# Patient Record
Sex: Female | Born: 1965 | Race: Black or African American | Hispanic: No | Smoking: Never smoker
Health system: Southern US, Community
[De-identification: ages and names within clinical notes are randomized; demographics above are authoritative.]

## PROBLEM LIST (undated history)

## (undated) DIAGNOSIS — F419 Anxiety disorder, unspecified: Secondary | ICD-10-CM

## (undated) DIAGNOSIS — D573 Sickle-cell trait: Secondary | ICD-10-CM

## (undated) DIAGNOSIS — R51 Headache: Secondary | ICD-10-CM

## (undated) DIAGNOSIS — R519 Headache, unspecified: Secondary | ICD-10-CM

## (undated) HISTORY — DX: Headache, unspecified: R51.9

## (undated) HISTORY — DX: Headache: R51

## (undated) HISTORY — DX: Anxiety disorder, unspecified: F41.9

## (undated) HISTORY — PX: TUBAL LIGATION: SHX77

---

## 2007-02-03 ENCOUNTER — Emergency Department: Payer: Self-pay | Admitting: Unknown Physician Specialty

## 2010-09-26 ENCOUNTER — Ambulatory Visit: Payer: Self-pay | Admitting: Neurology

## 2013-08-08 DIAGNOSIS — R42 Dizziness and giddiness: Secondary | ICD-10-CM | POA: Insufficient documentation

## 2013-08-08 DIAGNOSIS — R519 Headache, unspecified: Secondary | ICD-10-CM | POA: Insufficient documentation

## 2013-08-08 DIAGNOSIS — K219 Gastro-esophageal reflux disease without esophagitis: Secondary | ICD-10-CM | POA: Insufficient documentation

## 2013-08-08 DIAGNOSIS — R51 Headache: Secondary | ICD-10-CM

## 2013-11-12 ENCOUNTER — Ambulatory Visit: Payer: Self-pay | Admitting: Neurology

## 2014-06-03 ENCOUNTER — Other Ambulatory Visit: Payer: Self-pay

## 2014-06-03 DIAGNOSIS — Z1231 Encounter for screening mammogram for malignant neoplasm of breast: Secondary | ICD-10-CM

## 2014-08-02 ENCOUNTER — Emergency Department (HOSPITAL_COMMUNITY)
Admission: EM | Admit: 2014-08-02 | Discharge: 2014-08-03 | Disposition: A | Discharge status: Home / Self Care | Payer: BC Managed Care – PPO | Attending: Emergency Medicine | Admitting: Emergency Medicine

## 2014-08-02 ENCOUNTER — Emergency Department (HOSPITAL_COMMUNITY): Payer: BC Managed Care – PPO

## 2014-08-02 ENCOUNTER — Encounter (HOSPITAL_COMMUNITY): Payer: Self-pay | Admitting: Emergency Medicine

## 2014-08-02 DIAGNOSIS — M79601 Pain in right arm: Secondary | ICD-10-CM | POA: Diagnosis present

## 2014-08-02 DIAGNOSIS — Z862 Personal history of diseases of the blood and blood-forming organs and certain disorders involving the immune mechanism: Secondary | ICD-10-CM | POA: Insufficient documentation

## 2014-08-02 DIAGNOSIS — Z79899 Other long term (current) drug therapy: Secondary | ICD-10-CM | POA: Insufficient documentation

## 2014-08-02 DIAGNOSIS — R110 Nausea: Secondary | ICD-10-CM | POA: Insufficient documentation

## 2014-08-02 DIAGNOSIS — M5410 Radiculopathy, site unspecified: Secondary | ICD-10-CM | POA: Insufficient documentation

## 2014-08-02 DIAGNOSIS — Z8639 Personal history of other endocrine, nutritional and metabolic disease: Secondary | ICD-10-CM | POA: Insufficient documentation

## 2014-08-02 DIAGNOSIS — M79603 Pain in arm, unspecified: Secondary | ICD-10-CM

## 2014-08-02 HISTORY — DX: Sickle-cell trait: D57.3

## 2014-08-02 LAB — BASIC METABOLIC PANEL
ANION GAP: 13 (ref 5–15)
BUN: 13 mg/dL (ref 6–23)
CHLORIDE: 102 mmol/L (ref 96–112)
CO2: 22 mmol/L (ref 19–32)
CREATININE: 0.97 mg/dL (ref 0.50–1.10)
Calcium: 8.9 mg/dL (ref 8.4–10.5)
GFR, EST AFRICAN AMERICAN: 79 mL/min — AB (ref >90)
GFR, EST NON AFRICAN AMERICAN: 68 mL/min — AB (ref >90)
Glucose, Bld: 97 mg/dL (ref 70–99)
Potassium: 3.9 mmol/L (ref 3.5–5.1)
Sodium: 137 mmol/L (ref 135–145)

## 2014-08-02 LAB — CBC
HCT: 37.3 % (ref 36.0–46.0)
HEMOGLOBIN: 12.8 g/dL (ref 12.0–15.0)
MCH: 28.4 pg (ref 26.0–34.0)
MCHC: 34.3 g/dL (ref 30.0–36.0)
MCV: 82.7 fL (ref 78.0–100.0)
PLATELETS: 232 10*3/uL (ref 150–400)
RBC: 4.51 MIL/uL (ref 3.87–5.11)
RDW: 13.2 % (ref 11.5–15.5)
WBC: 5.9 10*3/uL (ref 4.0–10.5)

## 2014-08-02 LAB — I-STAT TROPONIN, ED: Troponin i, poc: 0 ng/mL (ref 0.00–0.08)

## 2014-08-02 MED ORDER — OXYCODONE-ACETAMINOPHEN 5-325 MG PO TABS
1.0000 | ORAL_TABLET | Freq: Once | ORAL | Status: AC
  Administered 2014-08-02: 1 via ORAL
  Filled 2014-08-02: qty 1

## 2014-08-02 MED ORDER — ONDANSETRON 4 MG PO TBDP
4.0000 mg | ORAL_TABLET | Freq: Once | ORAL | Status: AC
  Administered 2014-08-02: 4 mg via ORAL
  Filled 2014-08-02: qty 1

## 2014-08-02 MED ORDER — PREDNISONE 20 MG PO TABS
60.0000 mg | ORAL_TABLET | Freq: Once | ORAL | Status: AC
  Administered 2014-08-02: 60 mg via ORAL
  Filled 2014-08-02: qty 3

## 2014-08-02 NOTE — ED Notes (Signed)
C/o right arm numbness/ fingers tingling/ radiates to right chest area- has been taking ibuprofren without relief-- "I feel like I am not getting enough blood to my hand" slight swelling to right hand. Unable to sleep at night due to pain.

## 2014-08-02 NOTE — ED Notes (Addendum)
Pt reports pain in her right arm, mostly the lower arm that radiates up to her rt neck. Reports she feels numbness and pins and needles with no known injury to arm. Pt has full rom in rt arm with some decreased strength in her hand. Pt alert, oriented, nad.

## 2014-08-02 NOTE — ED Provider Notes (Signed)
CSN: [id number]     Arrival date & time 08/02/14  2055 History  This chart was scribed for non-physician practitioner Alexandra Pel, PA working with Jerelyn Scott, MD, by Tanda Rockers, ED Scribe. This patient was seen in room A06C/A06C and the patient's care was started at 11:12 PM.     Chief Complaint  Patient presents with  . right arm pain   . Joint Pain  . Chest Pain    The history is provided by the patient. No language interpreter was used.     HPI Comments: Alexandra Roberts is a 49 y.o. female who presents to the Emergency Department complaining of constant right arm pain that began 3 days ago. She also complains of numbness and paresthesia to the right shoulder down to her fingertips. Pt reports that the symptoms have gradually been worsening, causing her to come to the ED. Pt mentions that she has mild weakness to the arm as well. She does admit that she uses her arm everyday for cooking. Pt reports that the pain radiates to the right side of her right shoulder at night time. She was also feeling nauseas and dizzy earlier today while at church. She denies any recent injury, trauma, or strenuous activity that could have caused the pain.  She denies shortness of breath, diaphoresis, vomiting, painful leg swelling, or any other symptoms.   Past Medical History  Diagnosis Date  . Thyroid disease     no medicine -"just watching it"  . Sickle cell trait    Past Surgical History  Procedure Laterality Date  . Tubal ligation     No family history on file. History  Substance Use Topics  . Smoking status: Never Smoker   . Smokeless tobacco: Not on file  . Alcohol Use: Yes     Comment: socially   OB History    No data available     Review of Systems  Constitutional: Negative for diaphoresis.  Respiratory: Negative for shortness of breath.   Cardiovascular: Negative for chest pain and leg swelling.  Gastrointestinal: Positive for nausea. Negative for vomiting.  Musculoskeletal:  Positive for arthralgias (Right arm pain. ).  Neurological: Positive for weakness (to right arm. ). Negative for dizziness.       Paresthesia to right arm.   All other systems reviewed and are negative.     Allergies  Review of patient's allergies indicates not on file.  Home Medications   Prior to Admission medications   Medication Sig Start Date End Date Taking? Authorizing Provider  Naphazoline-Pheniramine (EYE ALLERGY RELIEF OP) Apply 1 drop to eye daily.   Yes Historical Provider, MD  Polyethyl Glycol-Propyl Glycol (SYSTANE OP) Apply 1 drop to eye daily.   Yes Historical Provider, MD  promethazine (PHENERGAN) 25 MG tablet Take 25 mg by mouth every 6 (six) hours as needed for nausea or vomiting.     Historical Provider, MD   Triage Vitals: BP 114/82 mmHg  Pulse 62  Temp(Src) 98.5 F (36.9 C) (Oral)  Resp 17  Ht  (1.702 m)  Wt 170 lb (77.111 kg)  BMI 26.62 kg/m2  SpO2 98%  LMP 07/02/2014 (Approximate)   Physical Exam  Constitutional: She is oriented to person, place, and time. She appears well-developed and well-nourished. No distress.  HENT:  Head: Normocephalic and atraumatic.  Eyes: Conjunctivae and EOM are normal.  Neck: Neck supple. No spinous process tenderness and no muscular tenderness present. No tracheal deviation present.  Cardiovascular: Normal rate.   Pulmonary/Chest:  Effort normal and breath sounds normal. No respiratory distress. She has no decreased breath sounds. She has no wheezes. She has no rhonchi. She has no rales. She exhibits no tenderness, no bony tenderness, no crepitus and no retraction.  Musculoskeletal: Normal range of motion.       Hands: Right arm has tenderness from proximal shoulder to distal finger. Her radial pulse is strong and physiologic. She has CR < 2-3  In all 5 fingers. No sign of rash. No wounds. FROM but with pain from shoulder to tips of fingers. Symmetrical strength to bilateral lower extremities. No abnormality to the left  extremity.  Neurological: She is alert and oriented to person, place, and time. No cranial nerve deficit.  Skin: Skin is warm and dry.  Psychiatric: She has a normal mood and affect. Her behavior is normal.  Nursing note and vitals reviewed.   ED Course  Procedures (including critical care time)  DIAGNOSTIC STUDIES: Oxygen Saturation is 98% on RA, normal by my interpretation.    COORDINATION OF CARE: 11:17 PM-Discussed treatment plan which includes CBC, BMP, Troponin with pt at bedside and pt agreed to plan.   Labs Review Labs Reviewed  BASIC METABOLIC PANEL - Abnormal; Notable for the following:    GFR calc non Af Amer 68 (*)    GFR calc Af Amer 79 (*)    All other components within normal limits  CBC  I-STAT TROPOININ, ED    Imaging Review Dg Chest 2 View  08/02/2014   CLINICAL DATA:  Left arm numbness in chest pain  EXAM: CHEST  2 VIEW  COMPARISON:  None.  FINDINGS: Normal heart size and mediastinal contours. No acute infiltrate or edema. No effusion or pneumothorax. No acute osseous findings.  IMPRESSION: Negative chest.   Electronically Signed   By: Marnee SpringJonathon  Watts M.D.   On: 08/02/2014 22:43     EKG Interpretation None      MDM   Final diagnoses:  Arm pain    Patients symptoms are MSK and worsened with palpation and movement. She denies having any injury that she can remember that caused her pain to start. She has some mild weakness to the arm which is hard to determine if this is due to true weakness or pain. I discussed getting head and neck CT to r/o nerve impingement versus cranial abnormality causing right arm pain, numbness, and weakness. She is agreeable.  At this time she is low risk for cardiac disease and is PERC negative. Her Troponin, CBC and BMP are all unremarkable. Her chest xray shows no acute findings. Her EKG shows sinus rhythm with normal heart rate.  At end of shift, patient [facility] to [facility] Peipenbrink, PA-C. CT head and neck are  pending. Patient will be given muscle relaxers, prednisone and pain medication if images are negative. Pt is agreeable to plan for CT scans.  Filed Vitals:   08/03/14 0015  BP: 126/86  Pulse: 61  Temp:   Resp:       I personally performed the services described in this documentation, which was scribed in my presence. The recorded information has been reviewed and is accurate.        Alexandra Peliffany Rigby Leonhardt, PA-C 08/03/14 0033  Alexandra Peliffany Abisai Deer, PA-C 08/03/14 [id number]  Shon Batonourtney F Horton, MD 08/05/14 1032

## 2014-08-03 ENCOUNTER — Emergency Department (HOSPITAL_COMMUNITY): Payer: BC Managed Care – PPO

## 2014-08-03 MED ORDER — CYCLOBENZAPRINE HCL 5 MG PO TABS: 5.0000 mg | ORAL_TABLET | Freq: Two times a day (BID) | ORAL | Status: DC | PRN

## 2014-08-03 MED ORDER — HYDROCODONE-ACETAMINOPHEN 5-325 MG PO TABS: 1.0000 | ORAL_TABLET | ORAL | Status: DC | PRN

## 2014-08-03 MED ORDER — PREDNISONE 10 MG PO TABS: 20.0000 mg | ORAL_TABLET | Freq: Every day | ORAL | Status: DC

## 2014-08-03 NOTE — Discharge Instructions (Signed)

## 2014-08-03 NOTE — ED Provider Notes (Signed)
Patient care acquired from Marlon Pel, PA-C  Results for orders placed or performed during the hospital encounter of 08/02/14  CBC  Result Value Ref Range   WBC 5.9 4.0 - 10.5 K/uL   RBC 4.51 3.87 - 5.11 MIL/uL   Hemoglobin 12.8 12.0 - 15.0 g/dL   HCT 16.1 09.6 - 04.5 %   MCV 82.7 78.0 - 100.0 fL   MCH 28.4 26.0 - 34.0 pg   MCHC 34.3 30.0 - 36.0 g/dL   RDW 40.9 81.1 - 91.4 %   Platelets 232 150 - 400 K/uL  Basic metabolic panel  Result Value Ref Range   Sodium 137 135 - 145 mmol/L   Potassium 3.9 3.5 - 5.1 mmol/L   Chloride 102 96 - 112 mmol/L   CO2 22 19 - 32 mmol/L   Glucose, Bld 97 70 - 99 mg/dL   BUN 13 6 - 23 mg/dL   Creatinine, Ser 7.82 0.50 - 1.10 mg/dL   Calcium 8.9 8.4 - 95.6 mg/dL   GFR calc non Af Amer 68 (L) >90 mL/min   GFR calc Af Amer 79 (L) >90 mL/min   Anion gap 13 5 - 15  I-stat troponin, ED (not at [facility])  Result Value Ref Range   Troponin i, poc 0.00 0.00 - 0.08 ng/mL   Comment 3           Dg Chest 2 View  08/02/2014   CLINICAL DATA:  Left arm numbness in chest pain  EXAM: CHEST  2 VIEW  COMPARISON:  None.  FINDINGS: Normal heart size and mediastinal contours. No acute infiltrate or edema. No effusion or pneumothorax. No acute osseous findings.  IMPRESSION: Negative chest.   Electronically Signed   By: Marnee Spring M.D.   On: 08/02/2014 22:43   Ct Head Wo Contrast  08/03/2014   CLINICAL DATA:  Right arm pain with numbness and paresthesias to the right shoulder and all arm for 3 days. Gradual worsening. Mild weakness. Nausea and dizzy today.  EXAM: CT HEAD WITHOUT CONTRAST  CT CERVICAL SPINE WITHOUT CONTRAST  TECHNIQUE: Multidetector CT imaging of the head and cervical spine was performed following the standard protocol without intravenous contrast. Multiplanar CT image reconstructions of the cervical spine were also generated.  COMPARISON:  MRI brain 11/12/2013.  CT head 09/26/2010.  FINDINGS: CT HEAD FINDINGS  Ventricles and sulci appear symmetrical. No  ventricular dilatation. No mass effect or midline shift. No abnormal extra-axial fluid collections. Gray-white matter junctions are distinct. Basal cisterns are not effaced. No evidence of acute intracranial hemorrhage. No depressed skull fractures. Visualized paranasal sinuses and mastoid air cells are not opacified.  CT CERVICAL SPINE FINDINGS  Normal alignment of the cervical spine and facet joints. C1-2 articulation appears intact. No vertebral compression deformities. Intervertebral disc space heights are preserved. No focal bone lesion or bone destruction. Bone cortex and trabecular architecture appear intact. No prevertebral soft tissue swelling. 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 Soft tissues are unremarkable. Diffuse enlargement of the thyroid gland without focal nodule identified. This likely indicates thyroid goiter.  IMPRESSION: No acute intracranial abnormalities. Normal alignment of the cervical spine. No acute displaced fractures identified. Probable thyroid goiter.   Electronically Signed   By: Burman Nieves M.D.   On: 08/03/2014 01:15   Ct Cervical Spine Wo Contrast  08/03/2014   CLINICAL DATA:  Right arm pain with numbness and paresthesias to the right shoulder and all arm for 3 days. Gradual worsening. Mild  weakness. Nausea and dizzy today.  EXAM: CT HEAD WITHOUT CONTRAST  CT CERVICAL SPINE WITHOUT CONTRAST  TECHNIQUE: Multidetector CT imaging of the head and cervical spine was performed following the standard protocol without intravenous contrast. Multiplanar CT image reconstructions of the cervical spine were also generated.  COMPARISON:  MRI brain 11/12/2013.  CT head 09/26/2010.  FINDINGS: CT HEAD FINDINGS  Ventricles and sulci appear symmetrical. No ventricular dilatation. No mass effect or midline shift. No abnormal extra-axial fluid collections. Gray-white matter junctions are distinct. Basal cisterns are not effaced. No evidence of acute intracranial hemorrhage. No  depressed skull fractures. Visualized paranasal sinuses and mastoid air cells are not opacified.  CT CERVICAL SPINE FINDINGS  Normal alignment of the cervical spine and facet joints. C1-2 articulation appears intact. No vertebral compression deformities. Intervertebral disc space heights are preserved. No focal bone lesion or bone destruction. Bone cortex and trabecular architecture appear intact. No prevertebral soft tissue swelling. 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 Soft tissues are unremarkable. Diffuse enlargement of the thyroid gland without focal nodule identified. This likely indicates thyroid goiter.  IMPRESSION: No acute intracranial abnormalities. Normal alignment of the cervical spine. No acute displaced fractures identified. Probable thyroid goiter.   Electronically Signed   By: Burman NievesWilliam  Stevens M.D.   On: 08/03/2014 01:15    1. Radiculopathy of arm   2. Arm pain    Filed Vitals:   08/03/14 0100  BP: 117/98  Pulse: 65  Temp:   Resp: 15   Afebrile, NAD, non-toxic appearing, AAOx4.   Discussed results with patient. Patient is aware of thyroid goiter and is being followed by PCP. Return precautions discussed. Patient is agreeable to plan. Patient is stable at time of discharge   Francee PiccoloJennifer Bethzaida Boord, PA-C 08/04/14 0050  Jerelyn ScottMartha Linker, MD 08/05/14 [phone]

## 2014-08-03 NOTE — ED Notes (Signed)
Patient transported to CT 

## 2015-04-19 IMAGING — CT CT HEAD W/O CM
4 of 6 series · 18 of 47 positions shown, 20 images · non-contrast
Comparison: MRI brain 11/12/2013.  CT head 09/26/2010.

CLINICAL DATA: Right arm pain with numbness and paresthesias to the
right shoulder and all arm for 3 days. Gradual worsening. Mild
weakness. Nausea and dizzy today.

EXAM:
CT HEAD WITHOUT CONTRAST
CT CERVICAL SPINE WITHOUT CONTRAST
TECHNIQUE: Multidetector CT imaging of the head and cervical spine was
performed following the standard protocol without intravenous
contrast. Multiplanar CT image reconstructions of the cervical spine
were also generated.

[Series 202: head w/o bone, idose (1) · axial · non-contrast · 0.44mm/px · z∈[+291,+371]mm · 4 of 64 slices shown]
[im 11/64  bone]
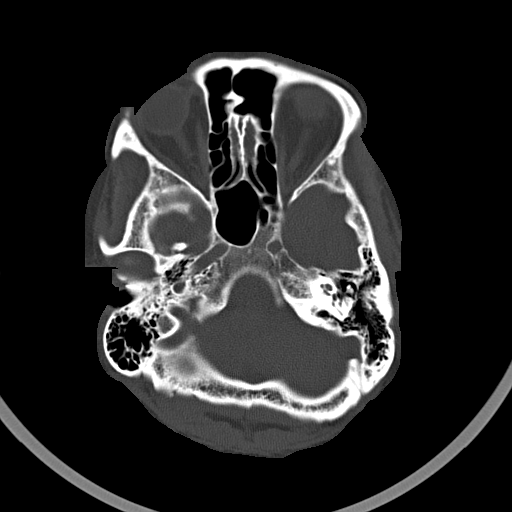
[im 22/64  bone]
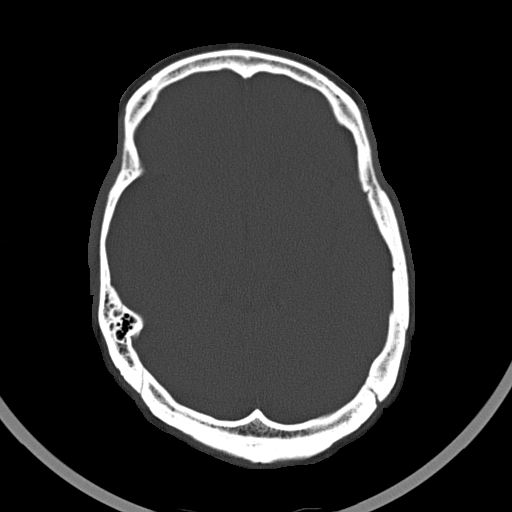
[im 32/64  bone]
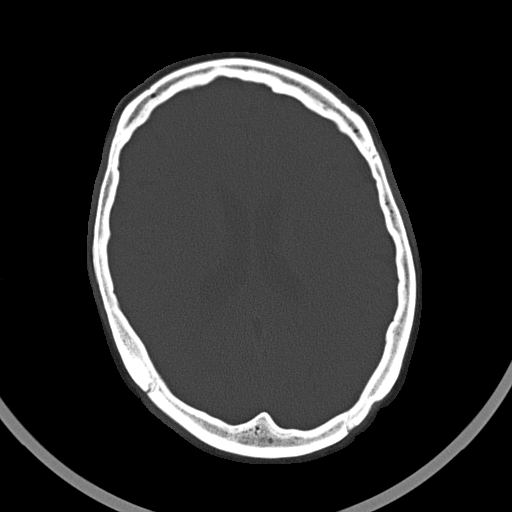
[im 43/64  bone]
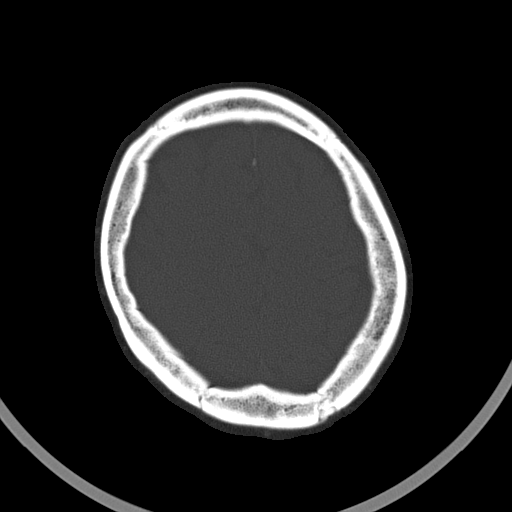

[Series 302: soft tissue, idose (2) · axial · 0.27mm/px · z∈[+143,+277]mm · 8 of 87 slices shown, 10 images]
[im 10/87  brain]
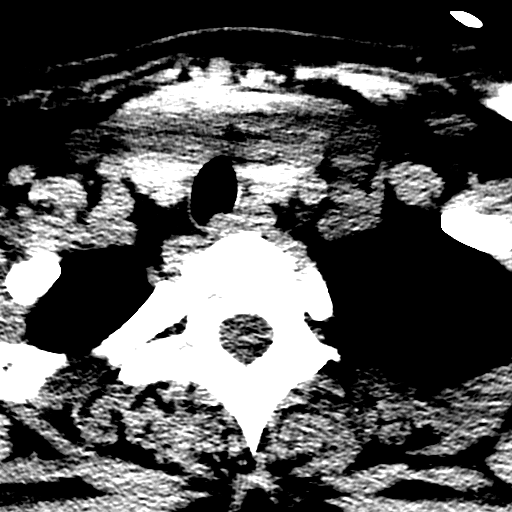
[im 10/87  bone]
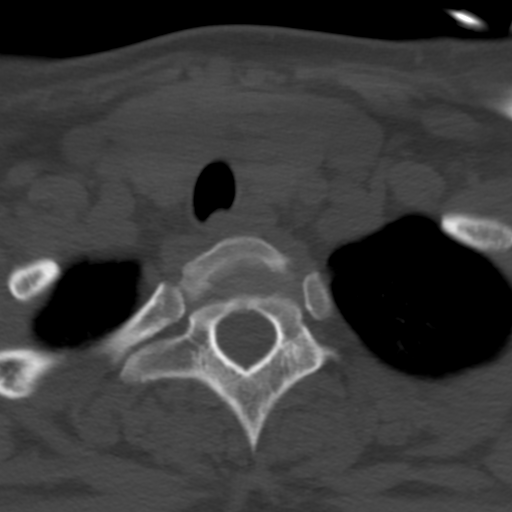
[im 20/87  brain]
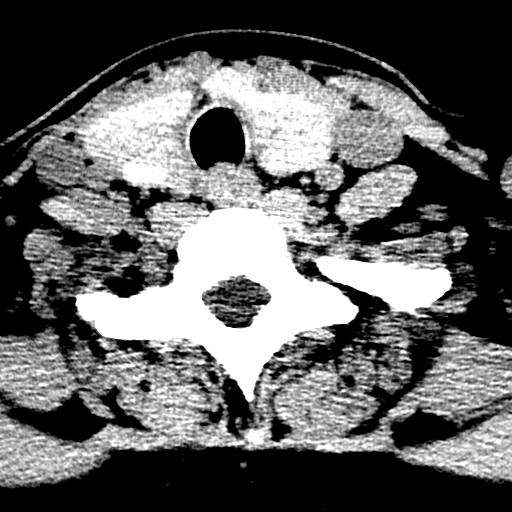
[im 29/87  brain]
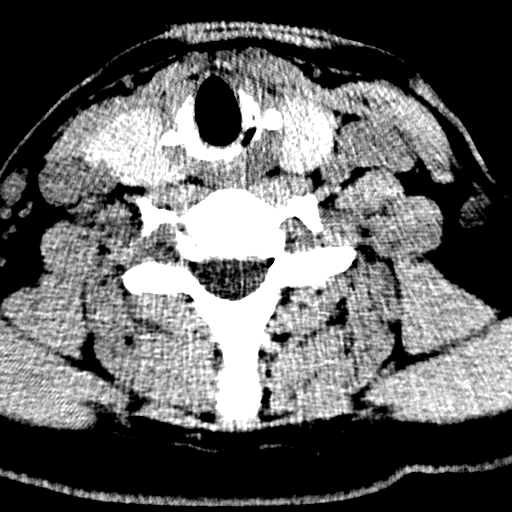
[im 39/87  brain]
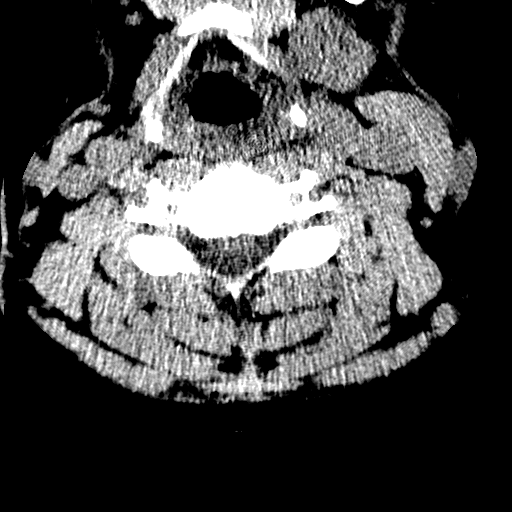
[im 48/87  brain]
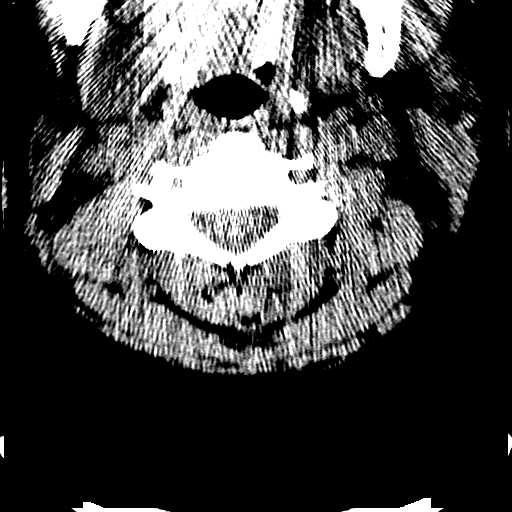
[im 48/87  bone]
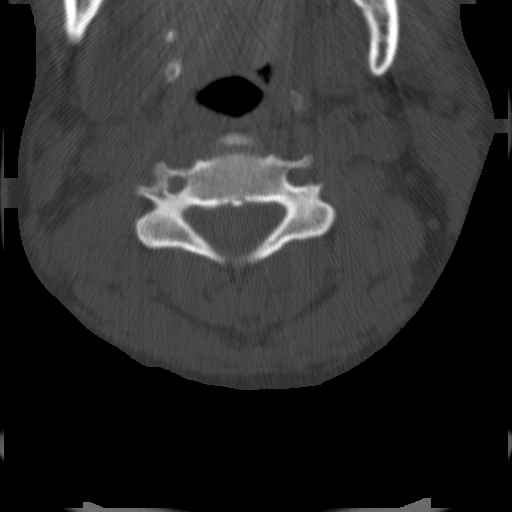
[im 58/87  brain]
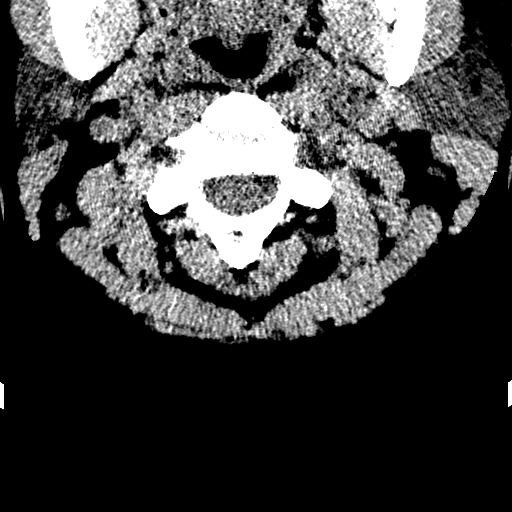
[im 67/87  brain]
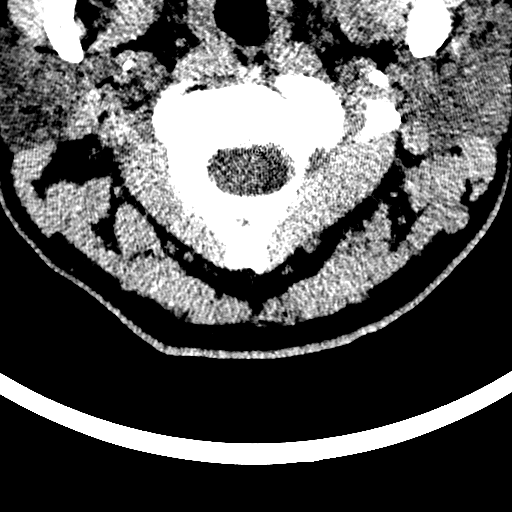
[im 77/87  brain]
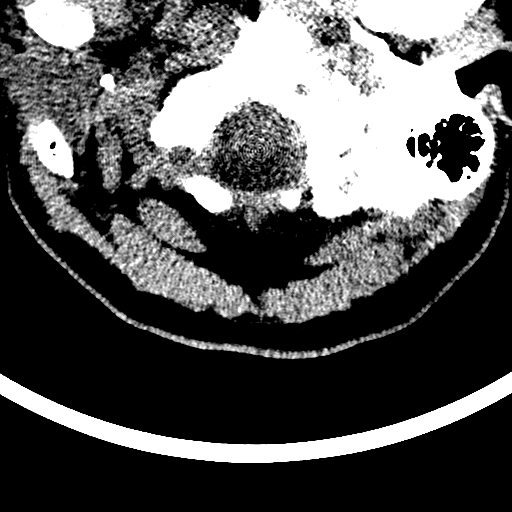

[Series 304: coronal, idose (2) · coronal · 0.33mm/px · 3 of 66 slices shown]
[im 22/66  brain]
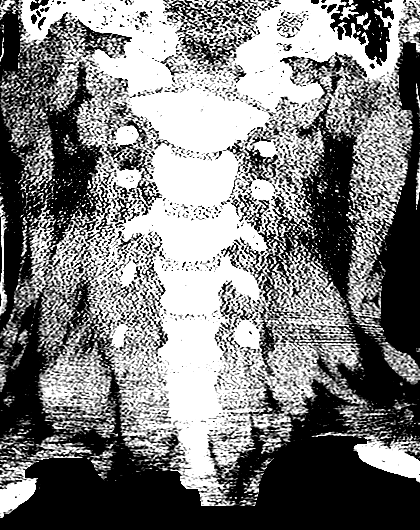
[im 29/66  brain]
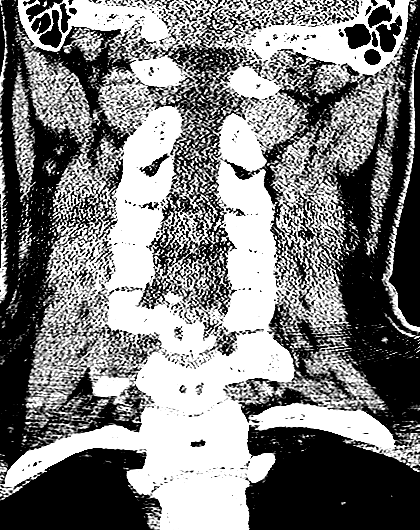
[im 37/66  brain]
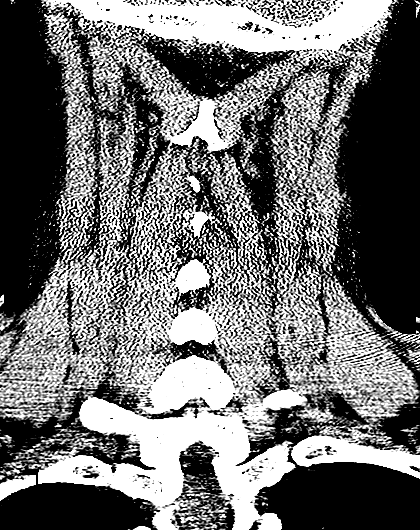

[Series 305: sagittal, idose (2) · sagittal · 0.33mm/px · 3 of 68 slices shown]
[im 23/68  brain]
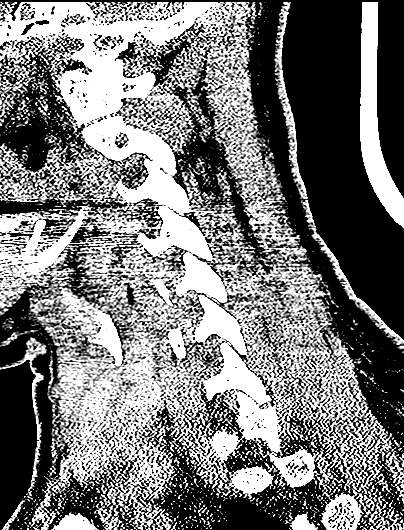
[im 34/68  brain]
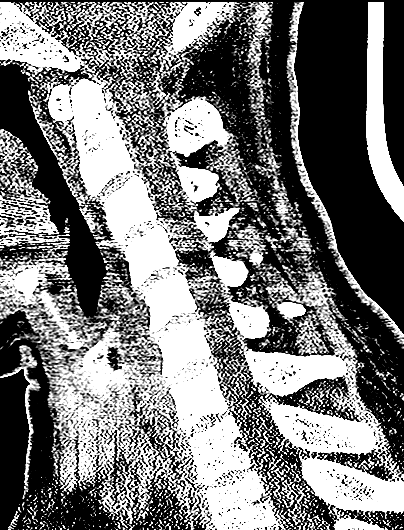
[im 45/68  brain]
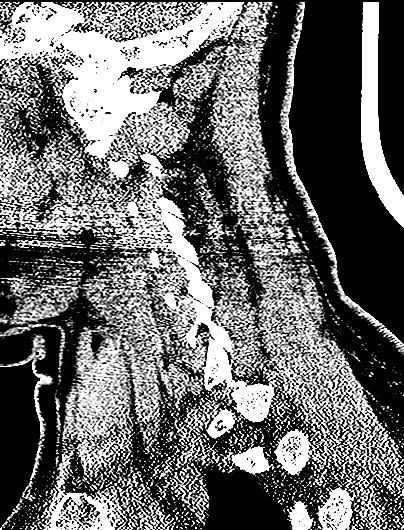

[18 of 47 positions shown; findings below may reference images not displayed]

FINDINGS: CT HEAD FINDINGS

Ventricles and sulci appear symmetrical. No ventricular dilatation.
No mass effect or midline shift. No abnormal extra-axial fluid
collections. Gray-white matter junctions are distinct. Basal
cisterns are not effaced. No evidence of acute intracranial
hemorrhage. No depressed skull fractures. Visualized paranasal
sinuses and mastoid air cells are not opacified.

CT CERVICAL SPINE FINDINGS

Normal alignment of the cervical spine and facet joints. C1-2
articulation appears intact. No vertebral compression deformities.
Intervertebral disc space heights are preserved. No focal bone
lesion or bone destruction. Bone cortex and trabecular architecture
appear intact. No prevertebral soft tissue swelling. Congenital non
fusion of the posterior arch of C1. Soft tissues are unremarkable.
Diffuse enlargement of the thyroid gland without focal nodule
identified. This likely indicates thyroid goiter.
IMPRESSION: No acute intracranial abnormalities. Normal alignment of the
cervical spine. No acute displaced fractures identified. Probable
thyroid goiter.

## 2015-07-30 ENCOUNTER — Ambulatory Visit: Payer: Self-pay | Admitting: Physician Assistant

## 2015-07-30 ENCOUNTER — Encounter: Payer: Self-pay | Admitting: Physician Assistant

## 2015-07-30 VITALS — BP 100/80 | HR 78 | Temp 99.1°F

## 2015-07-30 DIAGNOSIS — N390 Urinary tract infection, site not specified: Secondary | ICD-10-CM

## 2015-07-30 DIAGNOSIS — R300 Dysuria: Secondary | ICD-10-CM

## 2015-07-30 LAB — POCT URINALYSIS DIPSTICK
Bilirubin, UA: NEGATIVE
Glucose, UA: NEGATIVE
Ketones, UA: NEGATIVE
NITRITE UA: NEGATIVE
PH UA: 5.5
Protein, UA: NEGATIVE
Spec Grav, UA: >=1.03
Urobilinogen, UA: 0.2

## 2015-07-30 MED ORDER — CIPROFLOXACIN HCL 250 MG PO TABS: 250.0000 mg | ORAL_TABLET | Freq: Two times a day (BID) | ORAL | Status: AC

## 2015-07-30 NOTE — Progress Notes (Signed)
S:  C/o uti sx for 2 weeks, burning, urgency, frequency, some vaginal discharge, ?if its the monistat or her ; denies abdominal pain or flank pain:  Remainder ros neg, thought it was yeast at first so started using monistat  O:  Vitals wnl, nad, no cva tenderness, back nontender, lungs c t a,cv rrr, abd soft nontender, bs normal, n/v intact  A: uti  P: cipro 250mg  bid x 7d, increase water intake, add cranberry juice, return if not improving in 2 -3 days, return earlier if worsening, discussed pyelonephritis sx

## 2015-08-18 ENCOUNTER — Encounter: Payer: Self-pay | Admitting: Emergency Medicine

## 2015-08-18 ENCOUNTER — Emergency Department
Admission: EM | Admit: 2015-08-18 | Discharge: 2015-08-18 | Disposition: A | Discharge status: Home / Self Care | Payer: 59 | Attending: Emergency Medicine | Admitting: Emergency Medicine

## 2015-08-18 ENCOUNTER — Emergency Department: Payer: 59

## 2015-08-18 DIAGNOSIS — Y999 Unspecified external cause status: Secondary | ICD-10-CM | POA: Insufficient documentation

## 2015-08-18 DIAGNOSIS — Y9289 Other specified places as the place of occurrence of the external cause: Secondary | ICD-10-CM | POA: Diagnosis not present

## 2015-08-18 DIAGNOSIS — Y9389 Activity, other specified: Secondary | ICD-10-CM | POA: Insufficient documentation

## 2015-08-18 DIAGNOSIS — S60221A Contusion of right hand, initial encounter: Secondary | ICD-10-CM | POA: Insufficient documentation

## 2015-08-18 DIAGNOSIS — W228XXA Striking against or struck by other objects, initial encounter: Secondary | ICD-10-CM | POA: Insufficient documentation

## 2015-08-18 DIAGNOSIS — M79641 Pain in right hand: Secondary | ICD-10-CM | POA: Diagnosis present

## 2015-08-18 MED ORDER — NAPROXEN 500 MG PO TABS
500.0000 mg | ORAL_TABLET | Freq: Once | ORAL | Status: AC
  Administered 2015-08-18: 500 mg via ORAL
  Filled 2015-08-18: qty 1

## 2015-08-18 MED ORDER — NAPROXEN 500 MG PO TABS: 500.0000 mg | ORAL_TABLET | Freq: Two times a day (BID) | ORAL | Status: DC

## 2015-08-18 MED ORDER — SULFAMETHOXAZOLE-TRIMETHOPRIM 800-160 MG PO TABS: 1.0000 | ORAL_TABLET | Freq: Once | ORAL | Status: DC

## 2015-08-18 MED ORDER — TETANUS-DIPHTH-ACELL PERTUSSIS 5-2.5-18.5 LF-MCG/0.5 IM SUSP: 0.5000 mL | Freq: Once | INTRAMUSCULAR | Status: DC

## 2015-08-18 MED ORDER — IBUPROFEN 800 MG PO TABS: 800.0000 mg | ORAL_TABLET | Freq: Once | ORAL | Status: DC

## 2015-08-18 MED ORDER — OXYCODONE-ACETAMINOPHEN 7.5-325 MG PO TABS: 1.0000 | ORAL_TABLET | Freq: Four times a day (QID) | ORAL | Status: DC | PRN

## 2015-08-18 MED ORDER — TRAMADOL HCL 50 MG PO TABS
50.0000 mg | ORAL_TABLET | Freq: Once | ORAL | Status: DC
  Filled 2015-08-18: qty 1

## 2015-08-18 NOTE — ED Provider Notes (Signed)
[facility] Emergency Department Provider Note  ____________________________________________  Time seen: Approximately 10:12 PM  I have reviewed the triage vital signs and the nursing notes.   HISTORY  Chief Complaint No chief complaint on file.    HPI Alexandra Roberts is a 50 y.o. female patient complaining of right hand pain secondary to a contusion occurred at work today. Patient states she hit her hand on a metal structure at work and since that she's had pain radiating from the impact site to her upper arm.Patient stated this numbness to the fingertips. Notes that the patient is keeping the wrist and affects position. Patient states increased pain with extension or placed in a neutral position. Palliative measures for this complaint. Patient is left-hand dominant. Patient rates the pain as a 10 over 10.   Past Medical History  Diagnosis Date  . Thyroid disease     no medicine -"just watching it"  . Sickle cell trait (HCC)     There are no active problems to display for this patient.   Past Surgical History  Procedure Laterality Date  . Tubal ligation      Current Outpatient Rx  Name  Route  Sig  Dispense  Refill  . cyclobenzaprine (FLEXERIL) 5 MG tablet   Oral   Take 1 tablet (5 mg total) by mouth 2 (two) times daily as needed for muscle spasms. Patient not taking: Reported on 07/30/2015   20 tablet   0   . HYDROcodone-acetaminophen (NORCO/VICODIN) 5-325 MG per tablet   Oral   Take 1 tablet by mouth every 4 (four) hours as needed. Patient not taking: Reported on 07/30/2015   14 tablet   0   . Naphazoline-Pheniramine (EYE ALLERGY RELIEF OP)   Ophthalmic   Apply 1 drop to eye daily. Reported on 07/30/2015         . naproxen (NAPROSYN) 500 MG tablet   Oral   Take 1 tablet (500 mg total) by mouth 2 (two) times daily with a meal.   20 tablet   0   . oxyCODONE-acetaminophen (PERCOCET) 7.5-325 MG tablet   Oral   Take 1 tablet by mouth  every 6 (six) hours as needed for severe pain.   12 tablet   0   . Polyethyl Glycol-Propyl Glycol (SYSTANE OP)   Ophthalmic   Apply 1 drop to eye daily. Reported on 07/30/2015         . predniSONE (DELTASONE) 10 MG tablet   Oral   Take 2 tablets (20 mg total) by mouth daily. Patient not taking: Reported on 07/30/2015   21 tablet   0     Prednisone dose pack directions:   6 tabs on day ...   . promethazine (PHENERGAN) 25 MG tablet   Oral   Take 25 mg by mouth every 6 (six) hours as needed for nausea or vomiting. Reported on 07/30/2015           Allergies Review of patient's allergies indicates no known allergies.  No family history on file.  Social History Social History  Substance Use Topics  . Smoking status: Never Smoker   . Smokeless tobacco: None  . Alcohol Use: Yes     Comment: socially    Review of Systems Constitutional: No fever/chills Eyes: No visual changes. ENT: No sore throat. Cardiovascular: Denies chest pain. Respiratory: Denies shortness of breath. Gastrointestinal: No abdominal pain.  No nausea, no vomiting.  No diarrhea.  No constipation. Genitourinary: Negative for dysuria. Musculoskeletal:  Right hand pain.  Skin: Negative for rash. Neurological: Negative for headaches, focal weakness or numbness. Endocrine:Hypothyroidism Hematological/Lymphatic:Sickle cell trait ____________________________________________   PHYSICAL EXAM:  VITAL SIGNS: ED Triage Vitals  Enc Vitals Group     BP 08/18/15 2158 133/84 mmHg     Pulse Rate 08/18/15 2158 68     Resp 08/18/15 2158 20     Temp 08/18/15 2158 97.9 F (36.6 C)     Temp Source 08/18/15 2158 Oral     SpO2 08/18/15 2158 97 %     Weight 08/18/15 2158 170 lb (77.111 kg)     Height 08/18/15 2158  (1.702 m)     Head Cir --      Peak Flow --      Pain Score 08/18/15 2156 10     Pain Loc --      Pain Edu? --      Excl. in GC? --     Constitutional: Alert and oriented. Well appearing  and in no acute distress. Eyes: Conjunctivae are normal. PERRL. EOMI. Head: Atraumatic. Nose: No congestion/rhinnorhea. Mouth/Throat: Mucous membranes are moist.  Oropharynx non-erythematous. Neck: No stridor. No cervical spine tenderness to palpation. Hematological/Lymphatic/Immunilogical: No cervical lymphadenopathy. Cardiovascular: Normal rate, regular rhythm. Grossly normal heart sounds.  Good peripheral circulation. Respiratory: Normal respiratory effort.  No retractions. Lungs CTAB. Gastrointestinal: Soft and nontender. No distention. No abdominal bruits. No CVA tenderness. Musculoskeletal: No lower extremity tenderness nor edema.  No joint effusions. Neurologic:  Normal speech and language. No gross focal neurologic deficits are appreciated. No gait instability. Skin:  Skin is warm, dry and intact. No rash noted. Psychiatric: Mood and affect are normal. Speech and behavior are normal.  ____________________________________________   LABS (all labs ordered are listed, but only abnormal results are displayed)  Labs Reviewed - No data to display ____________________________________________  EKG   ____________________________________________  RADIOLOGY  No acute findings on x-ray of the right hand. I, Joni Reining, personally viewed and evaluated these images (plain radiographs) as part of my medical decision making, as well as reviewing the written report by the radiologist.  ____________________________________________   PROCEDURES  Procedure(s) performed: None  Critical Care performed: No  ____________________________________________   INITIAL IMPRESSION / ASSESSMENT AND PLAN / ED COURSE  Pertinent labs & imaging results that were available during my care of the patient were reviewed by me and considered in my medical decision making (see chart for details).  Right hand contusion. Discussed x-ray finding with patient. Patient placed in a volar splint and sling  for comfort. Patient given a prescription for naproxen and Percocets. Patient given a work note for limited duty. Patient advised to follow-up with family doctor if no improvement in 3 days. ____________________________________________   FINAL CLINICAL IMPRESSION(S) / ED DIAGNOSES  Final diagnoses:  Hand contusion, right, initial encounter       Joni Reining, PA-C 08/18/15 2250  Joni Reining, PA-C 08/18/15 2252  Maurilio Lovely, MD 08/18/15 2326

## 2015-08-18 NOTE — ED Notes (Signed)
Worker's comp complete at this time and delivered to lab for courier p/u.

## 2015-08-18 NOTE — ED Notes (Addendum)
Patient ambulatory to triage with steady gait, without difficulty or distress noted; pt reports hitting hand on a bar at work today; now with pain from right side of neck radiating down right arm; st "feels like I might have jammed it"; pt employeed with [employer]; workers comp profile indicates UDS and breath analysis required;

## 2015-08-18 NOTE — Discharge Instructions (Signed)
Wear splint and sling for 2-3 days as needed. Hand Contusion  A hand contusion is a deep bruise to the hand. Contusions happen when an injury causes bleeding under the skin. Signs of bruising include pain, puffiness (swelling), and discolored skin. The contusion may turn blue, purple, or yellow. HOME CARE  Put ice on the injured area.  Put ice in a plastic bag.  Place a towel between your skin and the bag.  Leave the ice on for 15-20 minutes, 03-04 times a day.  Only take medicines as told by your doctor.  Use an elastic wrap only as told. You may remove the wrap for sleeping, showering, and bathing. Take the wrap off if you lose feeling (have numbness) in your fingers, or they turn blue or cold. Put the wrap on more loosely.  Keep the hand raised (elevated) with pillows.  Avoid using your hand too much if it is painful. GET HELP RIGHT AWAY IF:   You have more redness, puffiness, or pain in your hand.  Your puffiness or pain does not get better with medicine.  You lose feeling in your hand, or you cannot move your fingers.  Your hand turns cold or blue.  You have pain when you move your fingers.  Your hand feels warm.  Your contusion does not get better in 2 days. MAKE SURE YOU:   Understand these instructions.  Will watch this condition.  Will get help right away if you are not doing well or you get worse.   This information is not intended to replace advice given to you by your health care provider. Make sure you discuss any questions you have with your health care provider.   Document Released: 10/04/2007 Document Revised: 05/08/2014 Document Reviewed: 10/09/2011 Elsevier Interactive Patient Education [employer]2016 [employer]

## 2015-08-20 ENCOUNTER — Encounter: Payer: Self-pay | Admitting: Nurse Practitioner

## 2015-08-20 ENCOUNTER — Ambulatory Visit (INDEPENDENT_AMBULATORY_CARE_PROVIDER_SITE_OTHER): Payer: 59 | Admitting: Nurse Practitioner

## 2015-08-20 VITALS — BP 98/70 | HR 69 | Temp 98.3°F | Ht 67.0 in | Wt 177.0 lb

## 2015-08-20 DIAGNOSIS — F411 Generalized anxiety disorder: Secondary | ICD-10-CM

## 2015-08-20 DIAGNOSIS — Z7189 Other specified counseling: Secondary | ICD-10-CM | POA: Diagnosis not present

## 2015-08-20 DIAGNOSIS — R350 Frequency of micturition: Secondary | ICD-10-CM | POA: Diagnosis not present

## 2015-08-20 DIAGNOSIS — K921 Melena: Secondary | ICD-10-CM

## 2015-08-20 DIAGNOSIS — Z7689 Persons encountering health services in other specified circumstances: Secondary | ICD-10-CM | POA: Insufficient documentation

## 2015-08-20 DIAGNOSIS — G44209 Tension-type headache, unspecified, not intractable: Secondary | ICD-10-CM

## 2015-08-20 DIAGNOSIS — K219 Gastro-esophageal reflux disease without esophagitis: Secondary | ICD-10-CM

## 2015-08-20 DIAGNOSIS — R300 Dysuria: Secondary | ICD-10-CM | POA: Diagnosis not present

## 2015-08-20 LAB — COMPREHENSIVE METABOLIC PANEL
ALBUMIN: 4.3 g/dL (ref 3.5–5.2)
ALK PHOS: 41 U/L (ref 39–117)
ALT: 10 U/L (ref 0–35)
AST: 14 U/L (ref 0–37)
BILIRUBIN TOTAL: 0.4 mg/dL (ref 0.2–1.2)
BUN: 12 mg/dL (ref 6–23)
CO2: 25 mEq/L (ref 19–32)
Calcium: 9.4 mg/dL (ref 8.4–10.5)
Chloride: 105 mEq/L (ref 96–112)
Creatinine, Ser: 0.78 mg/dL (ref 0.40–1.20)
GFR: 100.79 mL/min (ref >60.00)
GLUCOSE: 93 mg/dL (ref 70–99)
POTASSIUM: 4.2 meq/L (ref 3.5–5.1)
SODIUM: 139 meq/L (ref 135–145)
TOTAL PROTEIN: 7.1 g/dL (ref 6.0–8.3)

## 2015-08-20 LAB — T4, FREE: Free T4: 0.91 ng/dL (ref 0.60–1.60)

## 2015-08-20 LAB — TSH: TSH: 0.64 u[IU]/mL (ref 0.35–4.50)

## 2015-08-20 MED ORDER — BUSPIRONE HCL 7.5 MG PO TABS: 7.5000 mg | ORAL_TABLET | Freq: Three times a day (TID) | ORAL | Status: AC

## 2015-08-20 NOTE — Progress Notes (Signed)
Pre visit review using our clinic review tool, if applicable. No additional management support is needed unless otherwise documented below in the visit note. 

## 2015-08-20 NOTE — Patient Instructions (Addendum)
Welcome to [facility]! Nice to meet you.  Please visit the lab today  See you in 2 weeks for medication consultation.   Please take a probiotic ( Align, Floraque or Culturelle) helps with digestion concerns.   Start the buspirone at night and can take up to 2 x during the day after 7 days.   [facility]  Address: [address], [place], [place] [address]  Hours:  M-F 8AM-5PM  Phone: [phone]

## 2015-08-20 NOTE — Progress Notes (Signed)
Patient ID: Alexandra Roberts, female    DOB: 1965/08/23  Age: 50 y.o. MRN: [id number]  CC: New Patient (Initial Visit) and Dysuria   HPI Alexandra Roberts presents for establishing care and CC of urinary concerns, BM concerns, Forgetting, and other   1) New Pt Info:   Immunizations- Unknown   Mammogram- 2016- [facility]   Pap- Due, 3 years ago approx   2) Chronic Problems-  Blood in stool after straining last night   Thyroid problem- non-specific, just enlarged, used phentermine for wt loss 6-8 months ago last use.   Vertigo- see Frequent HA   Urinary incontinence- frequency   Counseling in the last 5 yrs-   Frequent HA, fainting spells- CT scan normal 3 years ago    Still feels light headed, HA- mild, dry eyes (restasis was too expensive). Saw Dr. Manuella Ghazi in past neurology   3) Acute Problems-  Urinalysis normal today, having frequency, recently checked for UTI w/ treatment with cipro, anxiousness associated with this. Intermittently dysuria.   BM- last year lost bowels, formed stools, small amount of bright red blood on tissue. Soft stool- smooth move stool tea yesterday. Goes twice a week approx. Uses laxatives- tea usually 1-2 x a month and a "pill" once, prune juice. Hard/bloated stomach   Forgetful- over the past few years, forgetful of names, dates   Anxiety- for years she reports, nervous, fidgeting, nervous stomach  Lot of stress at work, personal relationships at work, bullying    History Minsa has a past medical history of Thyroid disease; Sickle cell trait ([place]); Fainting spell; Dizziness; Frequent headaches; Frequent urination; Frequent bowel movements; Nervousness; Forgetfulness; and Vertigo.   She has past surgical history that includes Tubal ligation.   Her family history includes Hypertension in her mother.She reports that she has never smoked. She does not have any smokeless tobacco history on file. She reports that she drinks alcohol. She reports that she does not use illicit  drugs.  Outpatient Prescriptions Prior to Visit  Medication Sig Dispense Refill  . Naphazoline-Pheniramine (EYE ALLERGY RELIEF OP) Apply 1 drop to eye daily. Reported on 08/20/2015    . naproxen (NAPROSYN) 500 MG tablet Take 1 tablet (500 mg total) by mouth 2 (two) times daily with a meal. 20 tablet 0  . promethazine (PHENERGAN) 25 MG tablet Take 25 mg by mouth every 6 (six) hours as needed for nausea or vomiting. Reported on 07/30/2015    . Polyethyl Glycol-Propyl Glycol (SYSTANE OP) Apply 1 drop to eye daily. Reported on 08/20/2015    . cyclobenzaprine (FLEXERIL) 5 MG tablet Take 1 tablet (5 mg total) by mouth 2 (two) times daily as needed for muscle spasms. (Patient not taking: Reported on 07/30/2015) 20 tablet 0  . HYDROcodone-acetaminophen (NORCO/VICODIN) 5-325 MG per tablet Take 1 tablet by mouth every 4 (four) hours as needed. (Patient not taking: Reported on 07/30/2015) 14 tablet 0  . oxyCODONE-acetaminophen (PERCOCET) 7.5-325 MG tablet Take 1 tablet by mouth every 6 (six) hours as needed for severe pain. (Patient not taking: Reported on 08/20/2015) 12 tablet 0  . predniSONE (DELTASONE) 10 MG tablet Take 2 tablets (20 mg total) by mouth daily. (Patient not taking: Reported on 07/30/2015) 21 tablet 0   No facility-administered medications prior to visit.    ROS Review of Systems  Constitutional: Negative for fever, chills, diaphoresis and fatigue.  Respiratory: Negative for cough, chest tightness, shortness of breath and wheezing.   Cardiovascular: Negative for chest pain, palpitations and leg swelling.  Gastrointestinal:  Positive for blood in stool. Negative for nausea, vomiting and diarrhea.  Genitourinary: Positive for frequency. Negative for dysuria and urgency.  Musculoskeletal: Positive for arthralgias.       Right hand pain  Skin: Negative for rash.  Neurological: Positive for dizziness and headaches.  Psychiatric/Behavioral: Positive for confusion and decreased concentration.  Negative for suicidal ideas and sleep disturbance. The patient is nervous/anxious.     Objective:  BP 98/70 mmHg  Pulse 69  Temp(Src) 98.3 F (36.8 C) (Oral)  Ht 5' 7"  (1.702 m)  Wt 177 lb (80.287 kg)  BMI 27.72 kg/m2  SpO2 98%  LMP 08/04/2015 (Exact Date)  Physical Exam  Constitutional: She is oriented to person, place, and time. She appears well-developed and well-nourished. No distress.  HENT:  Head: Normocephalic and atraumatic.  Right Ear: External ear normal.  Left Ear: External ear normal.  Eyes: EOM are normal. Pupils are equal, round, and reactive to light. Right eye exhibits no discharge. Left eye exhibits no discharge. No scleral icterus.  Cardiovascular: Normal rate, regular rhythm and normal heart sounds.  Exam reveals no gallop and no friction rub.   No murmur heard. Pulmonary/Chest: Effort normal and breath sounds normal. No respiratory distress. She has no wheezes. She has no rales. She exhibits no tenderness.  Abdominal: Soft. Bowel sounds are normal. She exhibits no distension and no mass. There is no tenderness. There is no rebound and no guarding.  Genitourinary:  No sign of blood or hemorrhoids today  Musculoskeletal: She exhibits no edema or tenderness.  R arm in sling and wrapped- contusion and seen in ED on 4/19  Neurological: She is alert and oriented to person, place, and time. No cranial nerve deficit. She exhibits normal muscle tone. Coordination normal.  Skin: Skin is warm and dry. No rash noted. She is not diaphoretic.  Psychiatric: She has a normal mood and affect. Her behavior is normal. Judgment and thought content normal.   Assessment & Plan:   Masiah was seen today for new patient (initial visit) and dysuria.  Diagnoses and all orders for this visit:  Generalized anxiety disorder -     TSH -     T4, free -     Comp Met (CMET)  Encounter to establish care  Urinary frequency -     Urine Culture  Gastroesophageal reflux disease without  esophagitis  Tension-type headache, not intractable, unspecified chronicity pattern  Blood in stool  Other orders -     busPIRone (BUSPAR) 7.5 MG tablet; Take 1 tablet (7.5 mg total) by mouth 3 (three) times daily.   I have discontinued Ms. Preast's promethazine, predniSONE, HYDROcodone-acetaminophen, cyclobenzaprine, oxyCODONE-acetaminophen, and naproxen. I am also having her start on busPIRone. Additionally, I am having her maintain her Polyethyl Glycol-Propyl Glycol (SYSTANE OP) and Naphazoline-Pheniramine (EYE ALLERGY RELIEF OP).  Meds ordered this encounter  Medications  . busPIRone (BUSPAR) 7.5 MG tablet    Sig: Take 1 tablet (7.5 mg total) by mouth 3 (three) times daily.    Dispense:  90 tablet    Refill:  1    Order Specific Question:  Supervising Provider    Answer:  Crecencio Mc [[id number]]     Follow-up: Return in about 2 weeks (around 09/03/2015) for Wt loss medication .

## 2015-08-20 NOTE — Assessment & Plan Note (Addendum)
Discussed acute and chronic issues. Reviewed health maintenance measures, PFSHx, and immunizations.   Saw [facility] Dear at [facility] in past

## 2015-08-22 LAB — URINE CULTURE
COLONY COUNT: NO GROWTH
ORGANISM ID, BACTERIA: NO GROWTH

## 2015-08-29 ENCOUNTER — Encounter: Payer: Self-pay | Admitting: Nurse Practitioner

## 2015-08-29 DIAGNOSIS — R350 Frequency of micturition: Secondary | ICD-10-CM | POA: Insufficient documentation

## 2015-08-29 DIAGNOSIS — R300 Dysuria: Secondary | ICD-10-CM | POA: Insufficient documentation

## 2015-08-29 DIAGNOSIS — F411 Generalized anxiety disorder: Secondary | ICD-10-CM | POA: Insufficient documentation

## 2015-08-29 DIAGNOSIS — K921 Melena: Secondary | ICD-10-CM | POA: Insufficient documentation

## 2015-08-29 NOTE — Assessment & Plan Note (Signed)
Stable currently  Watches triggers

## 2015-08-29 NOTE — Assessment & Plan Note (Addendum)
No sign of blood today, no stool to sample  Pt was told to decrease use of laxatives  Stool softeners and fiber in diet  Probiotics recommended

## 2015-08-29 NOTE — Assessment & Plan Note (Addendum)
Counseling in past Work is primary aggravator Buspirone started and gave instructions on slow titration FU in 2 weeks

## 2015-08-29 NOTE — Assessment & Plan Note (Signed)
HA- pt reports frequent HA, vertigo, and anxiety. Suspect anxiety is a large player in these symptoms Checking basic labs today  FU 2 weeks and obtain records

## 2015-08-29 NOTE — Assessment & Plan Note (Signed)
Obtaining culture today to repeat after UTI Pt still reports frequency  Pt reports anxiousness is associated with this

## 2015-09-07 ENCOUNTER — Ambulatory Visit (INDEPENDENT_AMBULATORY_CARE_PROVIDER_SITE_OTHER): Payer: 59 | Admitting: Nurse Practitioner

## 2015-09-07 ENCOUNTER — Encounter: Payer: Self-pay | Admitting: Nurse Practitioner

## 2015-09-07 VITALS — BP 100/72 | HR 62 | Temp 97.7°F | Ht 67.0 in | Wt 180.0 lb

## 2015-09-07 DIAGNOSIS — E663 Overweight: Secondary | ICD-10-CM

## 2015-09-07 DIAGNOSIS — R350 Frequency of micturition: Secondary | ICD-10-CM | POA: Diagnosis not present

## 2015-09-07 LAB — POCT URINALYSIS DIPSTICK
BILIRUBIN UA: NEGATIVE
GLUCOSE UA: NEGATIVE
KETONES UA: NEGATIVE
Leukocytes, UA: NEGATIVE
Nitrite, UA: NEGATIVE
Protein, UA: NEGATIVE
RBC UA: NEGATIVE
SPEC GRAV UA: 1.02
Urobilinogen, UA: 0.2
pH, UA: 5.5

## 2015-09-07 MED ORDER — MIRABEGRON ER 25 MG PO TB24: 25.0000 mg | ORAL_TABLET | Freq: Every day | ORAL | Status: AC

## 2015-09-07 MED ORDER — PHENTERMINE HCL 37.5 MG PO TABS: 37.5000 mg | ORAL_TABLET | Freq: Every day | ORAL | Status: AC

## 2015-09-07 NOTE — Progress Notes (Signed)
Pre visit review using our clinic review tool, if applicable. No additional management support is needed unless otherwise documented below in the visit note. 

## 2015-09-07 NOTE — Progress Notes (Signed)
Patient ID: Alexandra Roberts, female    DOB: 31-Oct-1965  Age: 50 y.o. MRN: [id number]  CC: Follow-up   HPI Alexandra Roberts presents for follow up for weight loss medication.   1) Diet- 24 hr recall  Eggs and bacon Grapes Naked Taco- ground [place]key, lettuce, cheese, tomatoes, no shell Warm water with lemon and cranberry juice- cocktail box 8 oz of water daily  Gained 40 lbs per patient over the last year per pt  Been on phentermine in past with success and denies side effects like jitteriness, palpitations, nor chest pain.   2) Urinary frequency  Using the bathroom every 30 minutes she feels like. She can't go from 1 location to another without stopping to use the restroom.  Denies odor, hematuria  Nocturia 4-5 x at night No prior work up 2 years of symptoms   History Alexandra Roberts has a past medical history of Thyroid disease; Sickle cell trait ([facility]); Fainting spell; Dizziness; Frequent headaches; Frequent urination; Frequent bowel movements; Nervousness; Forgetfulness; and Vertigo.   She has past surgical history that includes Tubal ligation.   Her family history includes Hypertension in her mother.She reports that she has never smoked. She does not have any smokeless tobacco history on file. She reports that she drinks alcohol. She reports that she does not use illicit drugs.  Outpatient Prescriptions Prior to Visit  Medication Sig Dispense Refill  . busPIRone (BUSPAR) 7.5 MG tablet Take 1 tablet (7.5 mg total) by mouth 3 (three) times daily. 90 tablet 1  . Naphazoline-Pheniramine (EYE ALLERGY RELIEF OP) Apply 1 drop to eye daily. Reported on 08/20/2015    . Polyethyl Glycol-Propyl Glycol (SYSTANE OP) Apply 1 drop to eye daily. Reported on 08/20/2015     No facility-administered medications prior to visit.    ROS Review of Systems  Constitutional: Positive for appetite change. Negative for fever, chills, diaphoresis, activity change, fatigue and unexpected weight change.       Increased  appetite  Respiratory: Negative for chest tightness, shortness of breath and wheezing.   Cardiovascular: Negative for chest pain, palpitations and leg swelling.  Gastrointestinal: Negative for nausea, vomiting and diarrhea.  Genitourinary: Positive for frequency. Negative for dysuria, urgency, hematuria and decreased urine volume.  Skin: Negative for rash.  Neurological: Negative for dizziness, weakness, numbness and headaches.  Psychiatric/Behavioral: The patient is not nervous/anxious.     Objective:  BP 100/72 mmHg  Pulse 62  Temp(Src) 97.7 F (36.5 C) (Oral)  Ht 5\' 7"  (1.702 m)  Wt 180 lb (81.647 kg)  BMI 28.19 kg/m2  SpO2 98%  LMP 08/04/2015 (Exact Date)  Physical Exam  Constitutional: She is oriented to person, place, and time. She appears well-developed and well-nourished. No distress.  HENT:  Head: Normocephalic and atraumatic.  Right Ear: External ear normal.  Left Ear: External ear normal.  Cardiovascular: Normal rate, regular rhythm and normal heart sounds.   Pulmonary/Chest: Effort normal and breath sounds normal. No respiratory distress. She has no wheezes. She has no rales. She exhibits no tenderness.  Abdominal: Soft. Bowel sounds are normal. She exhibits no distension and no mass. There is no tenderness. There is no rebound and no guarding.  Neurological: She is alert and oriented to person, place, and time. No cranial nerve deficit. She exhibits normal muscle tone. Coordination normal.  Skin: Skin is warm and dry. No rash noted. She is not diaphoretic.  Psychiatric: She has a normal mood and affect. Her behavior is normal. Judgment and thought content normal.  Assessment & Plan:   Kishana was seen today for follow-up.  Diagnoses and all orders for this visit:  Urinary frequency -     POCT Urinalysis Dipstick  Overweight (BMI 25.0-29.9)  Other orders -     phentermine (ADIPEX-P) 37.5 MG tablet; Take 1 tablet (37.5 mg total) by mouth daily before  breakfast. -     mirabegron ER (MYRBETRIQ) 25 MG TB24 tablet; Take 1 tablet (25 mg total) by mouth daily.   I am having Alexandra Roberts start on phentermine and mirabegron ER. I am also having her maintain her Polyethyl Glycol-Propyl Glycol (SYSTANE OP), Naphazoline-Pheniramine (EYE ALLERGY RELIEF OP), and busPIRone.  Meds ordered this encounter  Medications  . phentermine (ADIPEX-P) 37.5 MG tablet    Sig: Take 1 tablet (37.5 mg total) by mouth daily before breakfast.    Dispense:  30 tablet    Refill:  2    Order Specific Question:  Supervising Provider    Answer:  Duncan Dull L [[id number]]  . mirabegron ER (MYRBETRIQ) 25 MG TB24 tablet    Sig: Take 1 tablet (25 mg total) by mouth daily.    Dispense:  30 tablet    Refill:  1    Order Specific Question:  Supervising Provider    Answer:  Sherlene Shams [[id number]]     Follow-up: Return in about 3 months (around 12/08/2015) for Follow up.

## 2015-09-07 NOTE — Patient Instructions (Signed)
Why follow it? Research shows. . Those who follow the Mediterranean diet have a reduced risk of heart disease  . The diet is associated with a reduced incidence of Parkinson's and Alzheimer's diseases . People following the diet may have longer life expectancies and lower rates of chronic diseases  . The Dietary Guidelines for Americans recommends the Mediterranean diet as an eating plan to promote health and prevent disease  What Is the Mediterranean Diet?  . Healthy eating plan based on typical foods and recipes of Mediterranean-style cooking . The diet is primarily a plant based diet; these foods should make up a majority of meals   Starches - Plant based foods should make up a majority of meals - They are an important sources of vitamins, minerals, energy, antioxidants, and fiber - Choose whole grains, foods high in fiber and minimally processed items  - Typical grain sources include wheat, oats, barley, corn, [place] rice, bulgar, farro, millet, polenta, couscous  - Various types of beans include chickpeas, lentils, fava beans, black beans, white beans   Fruits  Veggies - Large quantities of antioxidant rich fruits & veggies; 6 or more servings  - Vegetables can be eaten raw or lightly drizzled with oil and cooked  - Vegetables common to the traditional Mediterranean Diet include: artichokes, arugula, beets, broccoli, brussel sprouts, cabbage, carrots, celery, collard greens, cucumbers, eggplant, kale, leeks, lemons, lettuce, mushrooms, okra, onions, peas, peppers, potatoes, pumpkin, radishes, rutabaga, shallots, spinach, sweet potatoes, turnips, zucchini - Fruits common to the Mediterranean Diet include: apples, apricots, avocados, cherries, clementines, dates, figs, grapefruits, grapes, melons, nectarines, oranges, peaches, pears, pomegranates, strawberries, tangerines  Fats - Replace butter and margarine with healthy oils, such as olive oil, canola oil, and tahini  - Limit nuts to no  more than a handful a day  - Nuts include walnuts, almonds, pecans, pistachios, pine nuts  - Limit or avoid candied, honey roasted or heavily salted nuts - Olives are central to the Mediterranean diet - can be eaten whole or used in a variety of dishes   Meats Protein - Limiting red meat: no more than a few times a month - When eating red meat: choose lean cuts and keep the portion to the size of deck of cards - Eggs: approx. 0 to 4 times a week  - Fish and lean poultry: at least 2 a week  - Healthy protein sources include, chicken, turkey, lean beef, lamb - Increase intake of seafood such as tuna, salmon, trout, mackerel, shrimp, scallops - Avoid or limit high fat processed meats such as sausage and bacon  Dairy - Include moderate amounts of low fat dairy products  - Focus on healthy dairy such as fat free yogurt, skim milk, low or reduced fat cheese - Limit dairy products higher in fat such as whole or 2% milk, cheese, ice cream  Alcohol - Moderate amounts of red wine is ok  - No more than 5 oz daily for women (all ages) and men older than age 65  - No more than 10 oz of wine daily for men younger than 65  Other - Limit sweets and other desserts  - Use herbs and spices instead of salt to flavor foods  - Herbs and spices common to the traditional Mediterranean Diet include: basil, bay leaves, chives, cloves, cumin, fennel, garlic, lavender, marjoram, mint, oregano, parsley, pepper, rosemary, sage, savory, sumac, tarragon, thyme   It's not just a diet, it's a lifestyle:  . The Mediterranean diet includes   lifestyle factors typical of those in the region  . Foods, drinks and meals are best eaten with others and savored . Daily physical activity is important for overall good health . This could be strenuous exercise like running and aerobics . This could also be more leisurely activities such as walking, housework, yard-work, or taking the stairs . Moderation is the key; a balanced and  healthy diet accommodates most foods and drinks . Consider portion sizes and frequency of consumption of certain foods   Meal Ideas & Options:  . Breakfast:  o Whole wheat toast or whole wheat English muffins with peanut butter & hard boiled egg o Steel cut oats topped with apples & cinnamon and skim milk  o Fresh fruit: banana, strawberries, melon, berries, peaches  o Smoothies: strawberries, bananas, greek yogurt, peanut butter o Low fat greek yogurt with blueberries and granola  o Egg white omelet with spinach and mushrooms o Breakfast couscous: whole wheat couscous, apricots, skim milk, cranberries  . Sandwiches:  o Hummus and grilled vegetables (peppers, zucchini, squash) on whole wheat bread   o Grilled chicken on whole wheat pita with lettuce, tomatoes, cucumbers or tzatziki  o Tuna salad on whole wheat bread: tuna salad made with greek yogurt, olives, red peppers, capers, green onions o Garlic rosemary lamb pita: lamb sauted with garlic, rosemary, salt & pepper; add lettuce, cucumber, greek yogurt to pita - flavor with lemon juice and black pepper  . Seafood:  o Mediterranean grilled salmon, seasoned with garlic, basil, parsley, lemon juice and black pepper o Shrimp, lemon, and spinach whole-grain pasta salad made with low fat greek yogurt  o Seared scallops with lemon orzo  o Seared tuna steaks seasoned salt, pepper, coriander topped with tomato mixture of olives, tomatoes, olive oil, minced garlic, parsley, green onions and cappers  . Meats:  o Herbed greek chicken salad with kalamata olives, cucumber, feta  o Red bell peppers stuffed with spinach, bulgur, lean ground beef (or lentils) & topped with feta   o Kebabs: skewers of chicken, tomatoes, onions, zucchini, squash  o Turkey burgers: made with red onions, mint, dill, lemon juice, feta cheese topped with roasted red peppers . Vegetarian o Cucumber salad: cucumbers, artichoke hearts, celery, red onion, feta cheese, tossed in  olive oil & lemon juice  o Hummus and whole grain pita points with a greek salad (lettuce, tomato, feta, olives, cucumbers, red onion) o Lentil soup with celery, carrots made with vegetable broth, garlic, salt and pepper  o Tabouli salad: parsley, bulgur, mint, scallions, cucumbers, tomato, radishes, lemon juice, olive oil, salt and pepper.      

## 2015-09-08 ENCOUNTER — Telehealth: Payer: Self-pay | Admitting: Nurse Practitioner

## 2015-09-08 ENCOUNTER — Other Ambulatory Visit: Payer: Self-pay | Admitting: Nurse Practitioner

## 2015-09-08 DIAGNOSIS — N3281 Overactive bladder: Secondary | ICD-10-CM

## 2015-09-08 DIAGNOSIS — E663 Overweight: Secondary | ICD-10-CM | POA: Insufficient documentation

## 2015-09-08 NOTE — Telephone Encounter (Signed)
I will refer her to a urologist for further care at this point. Nothing else I can do.

## 2015-09-08 NOTE — Assessment & Plan Note (Signed)
POCT urine neg for infection Trial of myrbetriq sent to pharmacy FU in 2-3 months or sooner if needed.

## 2015-09-08 NOTE — Telephone Encounter (Signed)
Spoke with the pharmacy,  Per them there are no alternatives.  Not sure what to do?

## 2015-09-08 NOTE — Telephone Encounter (Signed)
Please Advise

## 2015-09-08 NOTE — Telephone Encounter (Signed)
Fax received from [facility], patient requesting a cheaper alternative this drug is a cost of $323, which patient can't afford. Please advise?

## 2015-09-08 NOTE — Telephone Encounter (Signed)
Caller name: Geradine Girtndrea Burkitt Relationship to patient: patient Can be reached: [phone]  Reason for call: Pt called stating that when she went to pick up the Myrbetriq at the pharmacy is was over $300. Pt stated that the pharmacy faxed a request for a cheaper rx on 09/07/15. Pt wanted to check the status. Please advise pt.msn

## 2015-09-08 NOTE — Telephone Encounter (Signed)
What did they suggest?

## 2015-09-08 NOTE — Assessment & Plan Note (Signed)
Prior use (over 6 months ago) of phentermine with success 3 months faxed to pharmacy  Use with diet and exercise.

## 2015-09-28 ENCOUNTER — Encounter: Payer: Self-pay | Admitting: Urology

## 2015-09-28 ENCOUNTER — Ambulatory Visit: Payer: Self-pay | Admitting: Urology

## 2015-10-06 ENCOUNTER — Ambulatory Visit (INDEPENDENT_AMBULATORY_CARE_PROVIDER_SITE_OTHER): Payer: 59 | Admitting: Family Medicine

## 2015-10-06 ENCOUNTER — Encounter: Payer: Self-pay | Admitting: Family Medicine

## 2015-10-06 VITALS — BP 116/84 | HR 78 | Temp 98.2°F | Ht 67.0 in | Wt 172.0 lb

## 2015-10-06 DIAGNOSIS — Z0000 Encounter for general adult medical examination without abnormal findings: Secondary | ICD-10-CM

## 2015-10-06 DIAGNOSIS — Z111 Encounter for screening for respiratory tuberculosis: Secondary | ICD-10-CM | POA: Diagnosis not present

## 2015-10-06 NOTE — Patient Instructions (Signed)
Be sure to get your mammogram and pap smear.   Follow up annually.  Take care  Dr. Adriana Simasook

## 2015-10-06 NOTE — Progress Notes (Signed)
Pre visit review using our clinic review tool, if applicable. No additional management support is needed unless otherwise documented below in the visit note. 

## 2015-10-07 ENCOUNTER — Encounter: Payer: Self-pay | Admitting: Family Medicine

## 2015-10-07 DIAGNOSIS — Z0000 Encounter for general adult medical examination without abnormal findings: Secondary | ICD-10-CM | POA: Insufficient documentation

## 2015-10-07 NOTE — Progress Notes (Signed)
Subjective:  Patient ID: Alexandra Roberts, female    DOB: 1966-02-18  Age: 50 y.o. MRN: [id number]  CC: Physical exam/form filled out for work  HPI:  50 year old female presents for an annual physical exam.  Preventative Healthcare  Pap smear: Up-to-date. Was last done in 2016.  Mammogram: Patient states her last mammogram was 2 years ago. Patient due for this.  Colonoscopy: N/A as she is not yet 50.  Immunizations  Tetanus - within last 10 years.   Pneumococcal - N/A.  Flu - N/A at this time.  Zoster - N/A.   Hepatitis C screening - N/A.  Alcohol use: See below.   Smoking/tobacco use: No.  STD/HIV testing: Has had screening previously.  PMH, Surgical Hx, Family Hx, Social History reviewed and updated as below.  Past Medical History  Diagnosis Date  . Sickle cell trait (HCC)   . Frequent headaches   . Anxiety    Past Surgical History  Procedure Laterality Date  . Tubal ligation     Family History  Problem Relation Age of Onset  . Hypertension Mother    Social History  Substance Use Topics  . Smoking status: Never Smoker   . Smokeless tobacco: Not on file  . Alcohol Use: 0.0 oz/week    0 Standard drinks or equivalent per week     Comment: socially   Review of Systems  Psychiatric/Behavioral: The patient is nervous/anxious.   All other systems reviewed and are negative.  Objective:  BP 116/84 mmHg  Pulse 78  Temp(Src) 98.2 F (36.8 C) (Oral)  Ht 5\' 7"  (1.702 m)  Wt 172 lb (78.019 kg)  BMI 26.93 kg/m2  SpO2 98%  BP/Weight 10/06/2015 09/07/2015 08/20/2015  Systolic BP 116 100 98  Diastolic BP 84 72 70  Wt. (Lbs) 172 180 177  BMI 26.93 28.19 27.72   Physical Exam  Constitutional: She is oriented to person, place, and time. She appears well-developed and well-nourished. No distress.  HENT:  Head: Normocephalic and atraumatic.  Nose: Nose normal.  Mouth/Throat: Oropharynx is clear and moist. No oropharyngeal exudate.  Normal TM's bilaterally.     Eyes: Conjunctivae are normal. No scleral icterus.  Neck: Neck supple.  Cardiovascular: Normal rate and regular rhythm.   No murmur heard. Pulmonary/Chest: Effort normal and breath sounds normal. She has no wheezes. She has no rales.  Abdominal: Soft. She exhibits no distension. There is no tenderness. There is no rebound and no guarding.  Musculoskeletal: Normal range of motion. She exhibits no edema.  Neurological: She is alert and oriented to person, place, and time.  Skin: Skin is warm and dry. No rash noted.  Psychiatric: She has a normal mood and affect.  Vitals reviewed.  Lab Results  Component Value Date   WBC 5.9 08/02/2014   HGB 12.8 08/02/2014   HCT 37.3 08/02/2014   PLT 232 08/02/2014   GLUCOSE 93 08/20/2015   ALT 10 08/20/2015   AST 14 08/20/2015   NA 139 08/20/2015   K 4.2 08/20/2015   CL 105 08/20/2015   CREATININE 0.78 08/20/2015   BUN 12 08/20/2015   CO2 25 08/20/2015   TSH 0.64 08/20/2015   Assessment & Plan:   Problem List Items Addressed This Visit    Well woman exam without gynecological exam - Primary    Past up-to-date. Patient in need of mammogram. She states that she will be getting this from her GYN office. Immunizations up-to-date. Patient in need of PPD today work. Work form  filled out.       Other Visit Diagnoses    PPD screening test        Relevant Orders    PPD (Completed)      Follow-up: Annually or sooner if needed.   Everlene Other DO [facility]

## 2015-10-07 NOTE — Assessment & Plan Note (Addendum)
Pap up-to-date. Patient in need of mammogram. She states that she will be getting this from her GYN office. Immunizations up-to-date. Patient in need of PPD today work. Work form filled out.

## 2015-10-08 ENCOUNTER — Ambulatory Visit: Payer: 59

## 2015-10-08 DIAGNOSIS — Z111 Encounter for screening for respiratory tuberculosis: Secondary | ICD-10-CM

## 2015-10-08 LAB — TB SKIN TEST: TB Skin Test: NEGATIVE

## 2015-10-08 NOTE — Progress Notes (Signed)
Patient came in to have PPD read.

## 2016-01-21 ENCOUNTER — Telehealth: Payer: Self-pay | Admitting: *Deleted

## 2016-01-21 NOTE — Telephone Encounter (Signed)
Spoke with patient, she stated that her employer lost her health screen form ,she will fax another form over for Dr Adriana Simasook to fill out

## 2016-04-20 ENCOUNTER — Other Ambulatory Visit: Payer: Self-pay | Admitting: Nurse Practitioner

## 2016-04-20 NOTE — Telephone Encounter (Signed)
Refilled 09/07/15. Pt last seen 10/06/15. No future appt scheduled. pleaseadvise?

## 2016-05-03 IMAGING — CR DG HAND COMPLETE 3+V*R*
1 series · 3 of 3 positions shown · non-contrast
Comparison: None.

CLINICAL DATA: Proximal right thumb pain radiating up the arm.
Injury at 6 p.m. this evening.

EXAM:
RIGHT HAND - COMPLETE 3+ VIEW

[Series 1: x hand pa right · 0.14mm/px · 3 of 3 slices shown]
[im 1/3]
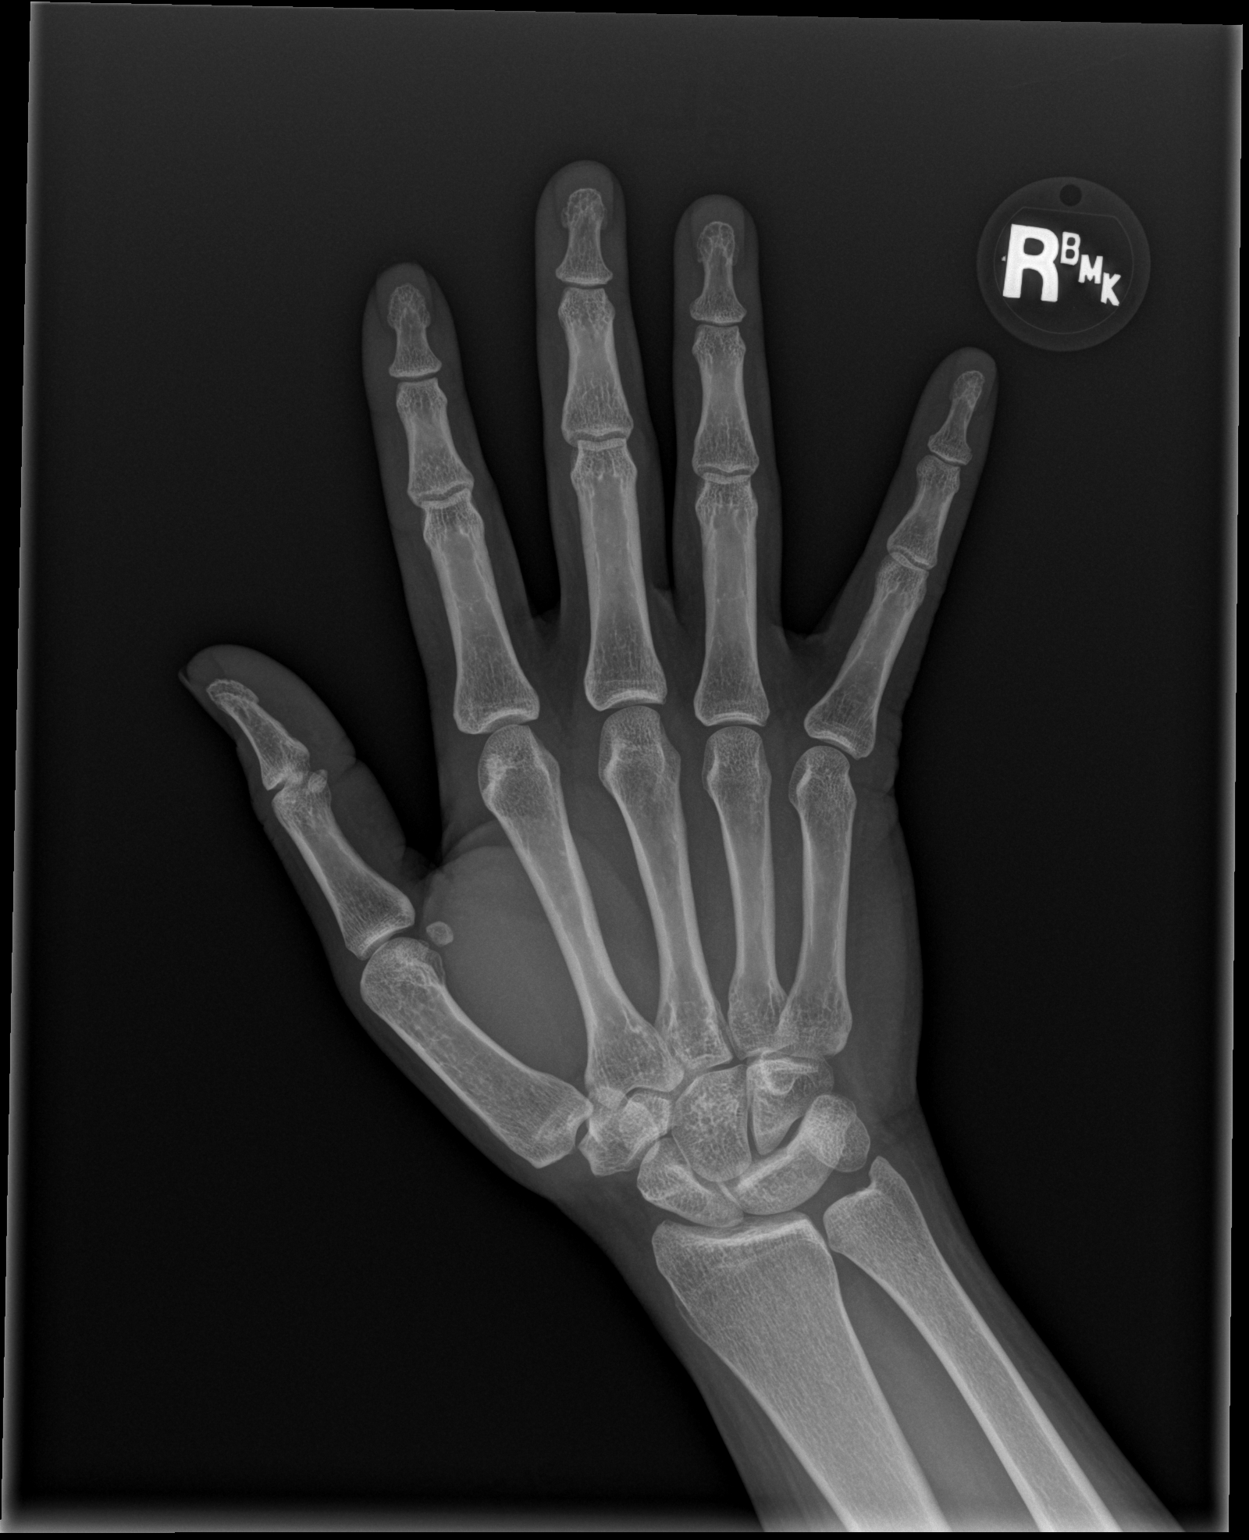
[im 2/3]
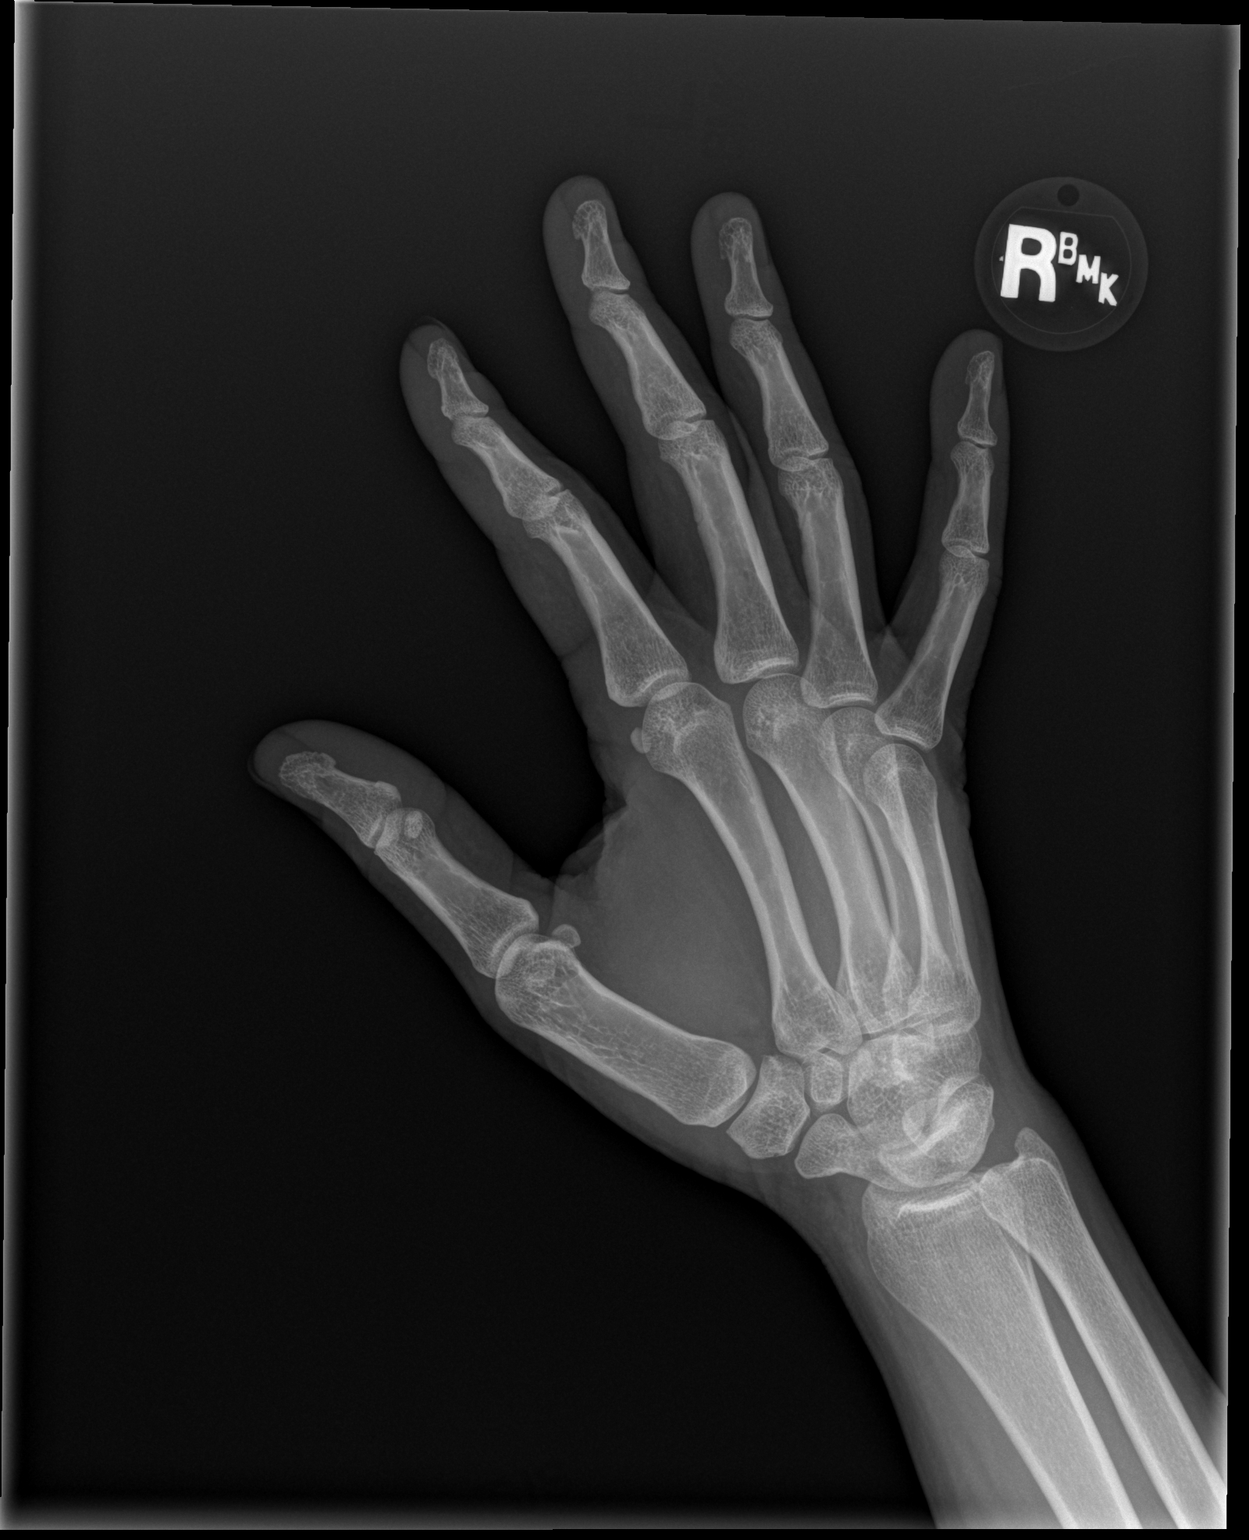
[im 3/3]
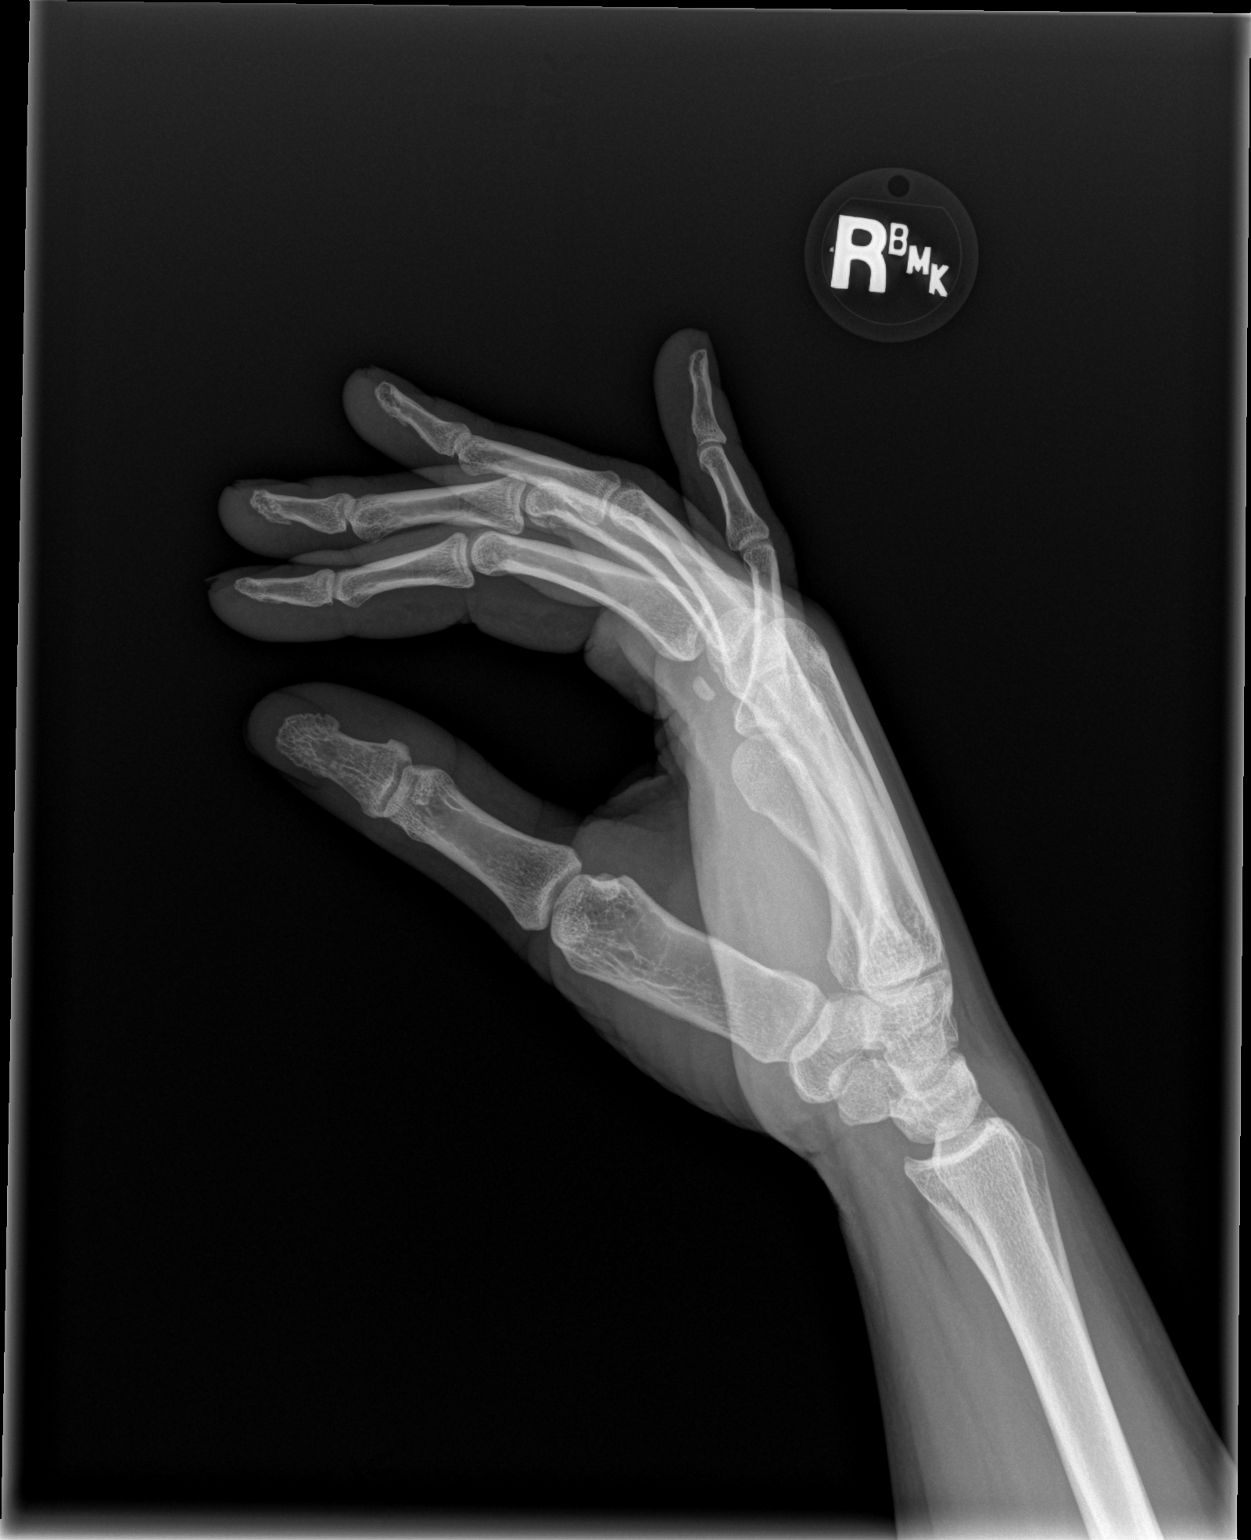

[3 of 3 positions shown; findings below may reference images not displayed]

FINDINGS: Coalition of the lunate and triquetrum bones. No evidence of acute
fracture or subluxation. No focal bone lesion or bone destruction.
Bone cortex and trabecular architecture appear intact. No radiopaque
soft tissue foreign bodies.
IMPRESSION: No acute bony abnormalities.
